# Patient Record
Sex: Female | Born: 1960 | Race: White | Hispanic: Yes | Marital: Married | Smoking: Former smoker
Health system: Southern US, Community
[De-identification: ages and names within clinical notes are randomized; demographics above are authoritative.]

---

## 2006-01-16 ENCOUNTER — Ambulatory Visit: Payer: Self-pay | Admitting: Family Medicine

## 2006-06-11 ENCOUNTER — Emergency Department: Payer: Self-pay | Admitting: Emergency Medicine

## 2006-09-17 ENCOUNTER — Emergency Department: Payer: Self-pay | Admitting: Emergency Medicine

## 2006-12-19 ENCOUNTER — Emergency Department: Payer: Self-pay | Admitting: Emergency Medicine

## 2007-06-17 ENCOUNTER — Emergency Department: Payer: Self-pay | Admitting: Emergency Medicine

## 2008-09-22 ENCOUNTER — Ambulatory Visit: Payer: Self-pay | Admitting: General Practice

## 2009-01-17 ENCOUNTER — Emergency Department: Payer: Self-pay | Admitting: Emergency Medicine

## 2009-12-15 ENCOUNTER — Ambulatory Visit: Payer: Self-pay | Admitting: General Practice

## 2010-06-22 ENCOUNTER — Other Ambulatory Visit: Payer: Self-pay | Admitting: General Practice

## 2010-11-05 ENCOUNTER — Ambulatory Visit: Payer: Self-pay | Admitting: Family Medicine

## 2011-09-11 ENCOUNTER — Encounter: Payer: Self-pay | Admitting: Physician Assistant

## 2011-09-14 ENCOUNTER — Encounter: Payer: Self-pay | Admitting: Physician Assistant

## 2011-10-14 ENCOUNTER — Encounter: Payer: Self-pay | Admitting: Physician Assistant

## 2012-08-20 ENCOUNTER — Ambulatory Visit: Payer: Self-pay | Admitting: Family Medicine

## 2014-02-08 ENCOUNTER — Ambulatory Visit: Payer: Self-pay | Admitting: Family Medicine

## 2014-04-01 ENCOUNTER — Emergency Department: Payer: Self-pay | Admitting: Emergency Medicine

## 2018-01-19 ENCOUNTER — Encounter: Payer: Self-pay | Admitting: *Deleted

## 2018-01-19 ENCOUNTER — Encounter (INDEPENDENT_AMBULATORY_CARE_PROVIDER_SITE_OTHER): Payer: Self-pay

## 2018-01-19 ENCOUNTER — Ambulatory Visit
Admission: RE | Admit: 2018-01-19 | Discharge: 2018-01-19 | Disposition: A | Discharge status: Home / Self Care | Payer: Self-pay | Source: Ambulatory Visit | Attending: Oncology | Admitting: Oncology

## 2018-01-19 ENCOUNTER — Ambulatory Visit: Payer: Self-pay | Attending: Oncology | Admitting: *Deleted

## 2018-01-19 VITALS — BP 122/83 | HR 65 | Resp 18 | Ht 62.0 in | Wt 165.2 lb

## 2018-01-19 DIAGNOSIS — Z0000 Encounter for general adult medical examination without abnormal findings: Secondary | ICD-10-CM

## 2018-01-19 NOTE — Progress Notes (Signed)
Letter mailed from the [facility] to inform patient of her normal mammogram results.  Patient is to follow-up with annual screening in one year.  HSIS to Christy. 

## 2018-01-19 NOTE — Patient Instructions (Signed)
Gave patient hand-out, Women Staying Healthy, Active and Well from [facility], with education on breast health, pap smears, heart and colon health. 

## 2018-01-19 NOTE — Progress Notes (Signed)
  Subjective:     Patient ID: Haley Hayden, female   DOB: 31-Jul-1960, 57 y.o.   MRN: [id number]  HPI   Review of Systems     Objective:   Physical Exam  Pulmonary/Chest: Right breast exhibits inverted nipple. Right breast exhibits no mass, no nipple discharge, no skin change and no tenderness. Left breast exhibits no inverted nipple, no mass, no nipple discharge, no skin change and no tenderness.  Right nipple inverted - normal per patient         Assessment:     57 year old English speaking Hispanic female returns to [facility] for annual exam.  Clinical breast exam unremarkable.  Taught self breast awareness.  Last pap 03/2014.  Patient states she is scheduled for her pap on Monday, October 14th at the [facility] for her pap smear.  Patient has been screened for eligibility.  She does not have any insurance, Medicare or Medicaid.  She also meets financial eligibility.  Hand-out given on the Affordable Care Act. Risk Assessment    Risk Scores      01/19/2018   Last edited by: Glory Buff, RN   5-year risk: 0.7 %   Lifetime risk: 4.5 %            Plan:     Screening mammogram ordered.  Will follow-up per [facility] protocol.

## 2018-03-24 ENCOUNTER — Encounter: Payer: Self-pay | Admitting: Family Medicine

## 2019-03-14 ENCOUNTER — Other Ambulatory Visit: Payer: Self-pay

## 2019-03-14 ENCOUNTER — Emergency Department: Admission: EM | Admit: 2019-03-14 | Discharge: 2019-03-14 | Discharge status: Against Medical Advice | Payer: Self-pay

## 2019-09-16 ENCOUNTER — Emergency Department
Admission: EM | Admit: 2019-09-16 | Discharge: 2019-09-16 | Disposition: A | Discharge status: Home / Self Care | Payer: BC Managed Care – PPO | Attending: Emergency Medicine | Admitting: Emergency Medicine

## 2019-09-16 ENCOUNTER — Emergency Department: Payer: BC Managed Care – PPO

## 2019-09-16 ENCOUNTER — Encounter: Payer: Self-pay | Admitting: *Deleted

## 2019-09-16 DIAGNOSIS — R519 Headache, unspecified: Secondary | ICD-10-CM | POA: Diagnosis not present

## 2019-09-16 DIAGNOSIS — J029 Acute pharyngitis, unspecified: Secondary | ICD-10-CM | POA: Diagnosis not present

## 2019-09-16 DIAGNOSIS — Z20822 Contact with and (suspected) exposure to covid-19: Secondary | ICD-10-CM | POA: Diagnosis not present

## 2019-09-16 DIAGNOSIS — R5383 Other fatigue: Secondary | ICD-10-CM | POA: Insufficient documentation

## 2019-09-16 DIAGNOSIS — Z8616 Personal history of COVID-19: Secondary | ICD-10-CM | POA: Insufficient documentation

## 2019-09-16 DIAGNOSIS — R509 Fever, unspecified: Secondary | ICD-10-CM | POA: Insufficient documentation

## 2019-09-16 DIAGNOSIS — M542 Cervicalgia: Secondary | ICD-10-CM | POA: Insufficient documentation

## 2019-09-16 DIAGNOSIS — Z87891 Personal history of nicotine dependence: Secondary | ICD-10-CM | POA: Diagnosis not present

## 2019-09-16 LAB — URINALYSIS, COMPLETE (UACMP) WITH MICROSCOPIC
Bilirubin Urine: NEGATIVE
Glucose, UA: NEGATIVE mg/dL
Hgb urine dipstick: NEGATIVE
Ketones, ur: NEGATIVE mg/dL
Leukocytes,Ua: NEGATIVE
Nitrite: NEGATIVE
Protein, ur: NEGATIVE mg/dL
Specific Gravity, Urine: 1.011 (ref 1.005–1.030)
pH: 6 (ref 5.0–8.0)

## 2019-09-16 LAB — CBC
HCT: 40.1 % (ref 36.0–46.0)
Hemoglobin: 13.5 g/dL (ref 12.0–15.0)
MCH: 29.6 pg (ref 26.0–34.0)
MCHC: 33.7 g/dL (ref 30.0–36.0)
MCV: 87.9 fL (ref 80.0–100.0)
Platelets: 208 10*3/uL (ref 150–400)
RBC: 4.56 MIL/uL (ref 3.87–5.11)
RDW: 12.3 % (ref 11.5–15.5)
WBC: 8.7 10*3/uL (ref 4.0–10.5)
nRBC: 0 % (ref 0.0–0.2)

## 2019-09-16 LAB — BASIC METABOLIC PANEL
Anion gap: 8 (ref 5–15)
BUN: 11 mg/dL (ref 6–20)
CO2: 29 mmol/L (ref 22–32)
Calcium: 9.3 mg/dL (ref 8.9–10.3)
Chloride: 103 mmol/L (ref 98–111)
Creatinine, Ser: 0.65 mg/dL (ref 0.44–1.00)
GFR calc Af Amer: >60 mL/min (ref >60)
GFR calc non Af Amer: >60 mL/min (ref >60)
Glucose, Bld: 100 mg/dL — ABNORMAL HIGH (ref 70–99)
Potassium: 3.9 mmol/L (ref 3.5–5.1)
Sodium: 140 mmol/L (ref 135–145)

## 2019-09-16 LAB — CSF CELL COUNT WITH DIFFERENTIAL
Eosinophils, CSF: 0 % (ref 0–1)
Eosinophils, CSF: 0 % (ref 0–1)
Lymphs, CSF: 0 % — ABNORMAL LOW (ref 40–80)
Lymphs, CSF: 0 % — ABNORMAL LOW (ref 40–80)
Monocyte-Macrophage-Spinal Fluid: 0 % — ABNORMAL LOW (ref 15–45)
Monocyte-Macrophage-Spinal Fluid: 0 % — ABNORMAL LOW (ref 15–45)
RBC Count, CSF: 0 /mm3
RBC Count, CSF: 288 /mm3 — ABNORMAL HIGH
Segmented Neutrophils-CSF: 0 % (ref 0–6)
Segmented Neutrophils-CSF: 0 % (ref 0–6)
Tube #: 1
Tube #: 1
WBC, CSF: 0 /mm3 (ref 0–5)
WBC, CSF: 0 /mm3 (ref 0–5)

## 2019-09-16 LAB — PROTEIN AND GLUCOSE, CSF
Glucose, CSF: 65 mg/dL (ref 40–70)
Total  Protein, CSF: 27 mg/dL (ref 15–45)

## 2019-09-16 LAB — SARS CORONAVIRUS 2 BY RT PCR (HOSPITAL ORDER, PERFORMED IN ~~LOC~~ HOSPITAL LAB): SARS Coronavirus 2: NEGATIVE

## 2019-09-16 MED ORDER — ONDANSETRON HCL 4 MG/2ML IJ SOLN
4.0000 mg | Freq: Once | INTRAMUSCULAR | Status: AC
  Administered 2019-09-16: 4 mg via INTRAVENOUS
  Filled 2019-09-16: qty 2

## 2019-09-16 MED ORDER — HYDROCODONE-ACETAMINOPHEN 5-325 MG PO TABS: 1.0000 | ORAL_TABLET | ORAL | 0 refills | Status: DC | PRN

## 2019-09-16 MED ORDER — LORAZEPAM 2 MG/ML IJ SOLN
1.0000 mg | Freq: Once | INTRAMUSCULAR | Status: AC
  Administered 2019-09-16: 1 mg via INTRAVENOUS
  Filled 2019-09-16: qty 1

## 2019-09-16 MED ORDER — DOXYCYCLINE HYCLATE 100 MG PO TABS: 100.0000 mg | ORAL_TABLET | Freq: Two times a day (BID) | ORAL | 0 refills | Status: DC

## 2019-09-16 MED ORDER — SODIUM CHLORIDE 0.9 % IV BOLUS
1000.0000 mL | Freq: Once | INTRAVENOUS | Status: AC
  Administered 2019-09-16: 1000 mL via INTRAVENOUS

## 2019-09-16 MED ORDER — FENTANYL CITRATE (PF) 100 MCG/2ML IJ SOLN
100.0000 ug | Freq: Once | INTRAMUSCULAR | Status: AC
  Administered 2019-09-16: 100 ug via INTRAVENOUS
  Filled 2019-09-16: qty 2

## 2019-09-16 MED ORDER — OXYCODONE-ACETAMINOPHEN 5-325 MG PO TABS
1.0000 | ORAL_TABLET | Freq: Once | ORAL | Status: AC
  Administered 2019-09-16: 1 via ORAL
  Filled 2019-09-16: qty 1

## 2019-09-16 MED ORDER — LIDOCAINE HCL (PF) 1 % IJ SOLN
5.0000 mL | Freq: Once | INTRAMUSCULAR | Status: AC
  Administered 2019-09-16: 5 mL
  Filled 2019-09-16: qty 5

## 2019-09-16 NOTE — ED Triage Notes (Signed)
Pt is here for neck stiffness and headache which began Sunday and has been getting worse.  Pt went to [facility] and was told that she needed to come to the ED.  Pt states that she has been taking tylenol and is unsure if she has had fever.  Pt is alert and oriented.  Pt was hypertensive at [facility]

## 2019-09-16 NOTE — ED Provider Notes (Signed)
[facility] Emergency Department Provider Note  Time seen: 5:42 PM  I have reviewed the triage vital signs and the nursing notes.   HISTORY  Chief Complaint Headache (neck stiffness)   HPI Haley Hayden is a 59 y.o. female presents to the emergency department for headache neck pain and fever.  According to the patient approximately 6 days ago she developed some sore throat and neck discomfort.  States over the weekend the neck became very stiff continue to have headache and generalized fatigue at times.  States yesterday she began experiencing subjective fever with worsening neck pain/stiffness.  No known sick contacts.  Patient has had Covid in the past and has been vaccinated as well.  Denies any shortness of breath cough.   History reviewed. No pertinent past medical history.  There are no problems to display for this patient.   History reviewed. No pertinent surgical history.  Prior to Admission medications   Not on File    No Known Allergies  Family History  Problem Relation Age of Onset  . Breast cancer Neg Hx     Social History Social History   Tobacco Use  . Smoking status: Former Games developer  . Smokeless tobacco: Never Used  Substance Use Topics  . Alcohol use: Not on file  . Drug use: Not on file    Review of Systems Constitutional: Positive for fever ENT: Negative for recent illness/congestion states mild sore throat. Cardiovascular: Negative for chest pain. Respiratory: Negative for shortness of breath. Gastrointestinal: Negative for abdominal pain, vomiting and diarrhea. Musculoskeletal: States neck pain and stiffness Neurological: Positive for headache All other ROS negative  ____________________________________________   PHYSICAL EXAM:  VITAL SIGNS: ED Triage Vitals  Enc Vitals Group     BP 09/16/19 1659 (!) 169/94     Pulse Rate 09/16/19 1659 (!) 102     Resp 09/16/19 1659 18     Temp 09/16/19 1659 100.2  F (37.9 C)     Temp src --      SpO2 09/16/19 1659 98 %     Weight 09/16/19 1658 182 lb (82.6 kg)     Height 09/16/19 1658 5\' 2"  (1.575 m)     Head Circumference --      Peak Flow --      Pain Score 09/16/19 1705 9     Pain Loc --      Pain Edu? --      Excl. in GC? --    Constitutional: Alert and oriented. Well appearing and in no distress. Eyes: Normal exam ENT      Head: Normocephalic and atraumatic.      Mouth/Throat: Mucous membranes are moist.  No significant pharyngeal erythema, no exudates or tonsillar hypertrophy. Cardiovascular: Normal rate, regular rhythm.  Respiratory: Normal respiratory effort without tachypnea nor retractions. Breath sounds are clear  Gastrointestinal: Soft and nontender. No distention.   Musculoskeletal: Nontender with normal range of motion in all extremities.  Patient does have moderate neck discomfort with attempted range of motion with stiffness/meningeal signs. Neurologic:  Normal speech and language. No gross focal neurologic deficits Skin:  Skin is warm, dry and intact.  Psychiatric: Mood and affect are normal.   ____________________________________________    EKG  EKG viewed and interpreted by myself shows a normal sinus rhythm at 93 bpm with a narrow QRS, normal axis, normal intervals, no concerning ST changes.  ____________________________________________    RADIOLOGY  CT head negative  ____________________________________________   INITIAL IMPRESSION /  ASSESSMENT AND PLAN / ED COURSE  Pertinent labs & imaging results that were available during my care of the patient were reviewed by me and considered in my medical decision making (see chart for details).   Patient presents to the emergency department for 5 to 6 days of neck pain stiffness headache generalized fatigue now with fever.  Does state mild sore throat however no pharyngeal erythema or exudates noted.  Patient does have a fairly stiff neck on examination.  Lab work is  reassuring.  Urine is pending.  We will obtain a CT scan of the head and likely proceed with lumbar puncture.  I have verbally consented the patient for lumbar puncture after discussing pros and cons.  CT scan head is negative.  Patient signed written consent form.  Procedure lumbar puncture, performed without issue.  Clear CSF.  We will send to the lab.  Awaiting urinalysis.  Patient's urinalysis is negative.  Overall patient appears well.  CSF is nonrevealing.  We will place on doxycycline as a precaution discharge patient with PCP follow-up.  Patient agreeable to plan of care.  LUMBAR PUNCTURE  Date/Time: 09/16/2019 at 7:17 PM Performed by: Harvest Dark  Consent: Verbal consent obtained. Written consent obtained. Risks and benefits: risks, benefits and alternatives were discussed Consent given by: Patient Patient understanding: patient states understanding of the procedure being performed  Patient consent: the patient's understanding of the procedure matches consent given  Procedure consent: procedure consent matches procedure scheduled  Relevant documents: relevant documents present and verified  Test results: test results available and properly labeled Site marked: the operative site was marked Imaging studies: imaging studies available  Required items: required blood products, implants, devices, and special equipment available  Patient identity confirmed: verbally with patient and arm band  Time out: Immediately prior to procedure a "time out" was called to verify the correct patient, procedure, equipment, support staff and site/side marked as required.  Indications: Evaluate for meningitis Anesthesia: local infiltration Local anesthetic: lidocaine 1% without epinephrine Anesthetic total: 7 ml Analgesia: Ativan prior to procedure Preparation: Patient was prepped and draped in the usual sterile fashion. Lumbar space: L3-L4 interspace Patient's position: left lateral  decubitus Needle gauge: 20 Needle length: 3.5 in Number of attempts: 1 Opening pressure: 22 cm H2O Fluid appearance: Clear Tubes of fluid: 4 Total volume: 8 ml Post-procedure: site cleaned and adhesive bandage applied Patient tolerance: Patient tolerated the procedure well with no immediate complications  Haley Hayden was evaluated in Emergency Department on 09/16/2019 for the symptoms described in the history of present illness. She was evaluated in the context of the global COVID-19 pandemic, which necessitated consideration that the patient might be at risk for infection with the SARS-CoV-2 virus that causes COVID-19. Institutional protocols and algorithms that pertain to the evaluation of patients at risk for COVID-19 are in a state of rapid change based on information released by regulatory bodies including the CDC and federal and state organizations. These policies and algorithms were followed during the patient's care in the ED.  ____________________________________________   FINAL CLINICAL IMPRESSION(S) / ED DIAGNOSES  Fever Neck pain   Harvest Dark, MD 09/16/19 2245

## 2019-09-20 LAB — CSF CULTURE W GRAM STAIN: Culture: NO GROWTH

## 2019-09-21 ENCOUNTER — Other Ambulatory Visit: Payer: Self-pay

## 2019-09-21 ENCOUNTER — Emergency Department
Admission: EM | Admit: 2019-09-21 | Discharge: 2019-09-21 | Disposition: A | Discharge status: Home / Self Care | Payer: BC Managed Care – PPO | Attending: Student in an Organized Health Care Education/Training Program | Admitting: Student in an Organized Health Care Education/Training Program

## 2019-09-21 ENCOUNTER — Emergency Department: Payer: BC Managed Care – PPO

## 2019-09-21 ENCOUNTER — Encounter: Payer: Self-pay | Admitting: Emergency Medicine

## 2019-09-21 DIAGNOSIS — Z8616 Personal history of COVID-19: Secondary | ICD-10-CM | POA: Diagnosis not present

## 2019-09-21 DIAGNOSIS — Z733 Stress, not elsewhere classified: Secondary | ICD-10-CM | POA: Diagnosis not present

## 2019-09-21 DIAGNOSIS — R509 Fever, unspecified: Secondary | ICD-10-CM | POA: Diagnosis not present

## 2019-09-21 DIAGNOSIS — R519 Headache, unspecified: Secondary | ICD-10-CM | POA: Insufficient documentation

## 2019-09-21 DIAGNOSIS — Z87891 Personal history of nicotine dependence: Secondary | ICD-10-CM | POA: Insufficient documentation

## 2019-09-21 DIAGNOSIS — M542 Cervicalgia: Secondary | ICD-10-CM | POA: Insufficient documentation

## 2019-09-21 LAB — CBC WITH DIFFERENTIAL/PLATELET
Abs Immature Granulocytes: 0.02 10*3/uL (ref 0.00–0.07)
Basophils Absolute: 0 10*3/uL (ref 0.0–0.1)
Basophils Relative: 1 %
Eosinophils Absolute: 0.1 10*3/uL (ref 0.0–0.5)
Eosinophils Relative: 1 %
HCT: 40.1 % (ref 36.0–46.0)
Hemoglobin: 13.3 g/dL (ref 12.0–15.0)
Immature Granulocytes: 0 %
Lymphocytes Relative: 28 %
Lymphs Abs: 1.5 10*3/uL (ref 0.7–4.0)
MCH: 29.5 pg (ref 26.0–34.0)
MCHC: 33.2 g/dL (ref 30.0–36.0)
MCV: 88.9 fL (ref 80.0–100.0)
Monocytes Absolute: 0.3 10*3/uL (ref 0.1–1.0)
Monocytes Relative: 5 %
Neutro Abs: 3.5 10*3/uL (ref 1.7–7.7)
Neutrophils Relative %: 65 %
Platelets: 257 10*3/uL (ref 150–400)
RBC: 4.51 MIL/uL (ref 3.87–5.11)
RDW: 12 % (ref 11.5–15.5)
WBC: 5.4 10*3/uL (ref 4.0–10.5)
nRBC: 0 % (ref 0.0–0.2)

## 2019-09-21 LAB — COMPREHENSIVE METABOLIC PANEL
ALT: 40 U/L (ref 0–44)
AST: 25 U/L (ref 15–41)
Albumin: 3.9 g/dL (ref 3.5–5.0)
Alkaline Phosphatase: 74 U/L (ref 38–126)
Anion gap: 9 (ref 5–15)
BUN: 12 mg/dL (ref 6–20)
CO2: 29 mmol/L (ref 22–32)
Calcium: 9.3 mg/dL (ref 8.9–10.3)
Chloride: 103 mmol/L (ref 98–111)
Creatinine, Ser: 0.63 mg/dL (ref 0.44–1.00)
GFR calc Af Amer: >60 mL/min (ref >60)
GFR calc non Af Amer: >60 mL/min (ref >60)
Glucose, Bld: 130 mg/dL — ABNORMAL HIGH (ref 70–99)
Potassium: 3.8 mmol/L (ref 3.5–5.1)
Sodium: 141 mmol/L (ref 135–145)
Total Bilirubin: 0.8 mg/dL (ref 0.3–1.2)
Total Protein: 7.4 g/dL (ref 6.5–8.1)

## 2019-09-21 MED ORDER — PROCHLORPERAZINE EDISYLATE 10 MG/2ML IJ SOLN
10.0000 mg | Freq: Once | INTRAMUSCULAR | Status: AC
  Administered 2019-09-21: 10 mg via INTRAVENOUS
  Filled 2019-09-21: qty 2

## 2019-09-21 MED ORDER — PROCHLORPERAZINE MALEATE 10 MG PO TABS: 10.0000 mg | ORAL_TABLET | Freq: Four times a day (QID) | ORAL | 0 refills | Status: AC | PRN

## 2019-09-21 MED ORDER — ACETAMINOPHEN 325 MG PO TABS
650.0000 mg | ORAL_TABLET | Freq: Once | ORAL | Status: AC
  Administered 2019-09-21: 650 mg via ORAL
  Filled 2019-09-21: qty 2

## 2019-09-21 MED ORDER — DIPHENHYDRAMINE HCL 50 MG/ML IJ SOLN
12.5000 mg | Freq: Once | INTRAMUSCULAR | Status: AC
  Administered 2019-09-21: 12.5 mg via INTRAVENOUS
  Filled 2019-09-21: qty 1

## 2019-09-21 MED ORDER — IOHEXOL 350 MG/ML SOLN
75.0000 mL | Freq: Once | INTRAVENOUS | Status: AC | PRN
  Administered 2019-09-21: 75 mL via INTRAVENOUS

## 2019-09-21 NOTE — ED Notes (Signed)
ED Provider at bedside. 

## 2019-09-21 NOTE — Discharge Instructions (Signed)

## 2019-09-21 NOTE — ED Notes (Signed)
Pt returned from CT via stretcher.

## 2019-09-21 NOTE — ED Provider Notes (Signed)
[facility] Emergency Department Provider Note    First MD Initiated Contact with Patient 09/21/19 0957     (approximate)  I have reviewed the triage vital signs and the nursing notes.   HISTORY  Chief Complaint Headache and Emesis    HPI Haley Hayden is a 59 y.o. female no significant past medical history other than being diagnosed with Covid almost 1 year ago to the day.  Presents to the ER for persistent headache x1 week.  Had extensive work-up last week for similar headache was reportedly having some fevers at that time was discharged home on doxycycline.  LP and CSF studies were noninfectious with negative cultures.  Denies any numbness or tingling.  No history of DVT or blood clots.  Not on any anticoagulation.  Denies any trauma.  Still having persistent headache feels like she will have some tension in her neck.  States that she is also undergoing some stress at home.  Denies any chest pain or shortness of breath.  No fevers or chills.    History reviewed. No pertinent past medical history. Family History  Problem Relation Age of Onset  . Breast cancer Neg Hx    History reviewed. No pertinent surgical history. There are no problems to display for this patient.     Prior to Admission medications   Medication Sig Start Date End Date Taking? Authorizing Provider  doxycycline (VIBRA-TABS) 100 MG tablet Take 1 tablet (100 mg total) by mouth 2 (two) times daily. 09/16/19   Minna Antis, MD  HYDROcodone-acetaminophen (NORCO/VICODIN) 5-325 MG tablet Take 1 tablet by mouth every 4 (four) hours as needed. 09/16/19   Minna Antis, MD  prochlorperazine (COMPAZINE) 10 MG tablet Take 1 tablet (10 mg total) by mouth every 6 (six) hours as needed for nausea or vomiting. 09/21/19   Willy Eddy, MD    Allergies Patient has no known allergies.    Social History Social History   Tobacco Use  . Smoking status: Former Games developer  .  Smokeless tobacco: Never Used  Substance Use Topics  . Alcohol use: Never  . Drug use: Never    Review of Systems Patient denies headaches, rhinorrhea, blurry vision, numbness, shortness of breath, chest pain, edema, cough, abdominal pain, nausea, vomiting, diarrhea, dysuria, fevers, rashes or hallucinations unless otherwise stated above in HPI. ____________________________________________   PHYSICAL EXAM:  VITAL SIGNS: Vitals:   09/21/19 0910  BP: (!) 155/84  Pulse: 69  Resp: 16  Temp: 98.2 F (36.8 C)  SpO2: 96%    Constitutional: Alert and oriented.  Eyes: Conjunctivae are normal.  Head: Atraumatic. Nose: No congestion/rhinnorhea. Mouth/Throat: Mucous membranes are moist.   Neck: No stridor. Painless ROM.  Cardiovascular: Normal rate, regular rhythm. Grossly normal heart sounds.  Good peripheral circulation. Respiratory: Normal respiratory effort.  No retractions. Lungs CTAB. Gastrointestinal: Soft and nontender. No distention. No abdominal bruits. No CVA tenderness. Genitourinary:  Musculoskeletal: No lower extremity tenderness nor edema.  No joint effusions. Neurologic:  CN- intact.  No facial droop, Normal FNF.  Normal heel to shin.  Sensation intact bilaterally. Normal speech and language. No gross focal neurologic deficits are appreciated. No gait instability. Skin:  Skin is warm, dry and intact. No rash noted. Psychiatric: Mood and affect are normal. Speech and behavior are normal.  ____________________________________________   LABS (all labs ordered are listed, but only abnormal results are displayed)  Results for orders placed or performed during the hospital encounter of 09/21/19 (from the past  24 hour(s))  Comprehensive metabolic panel     Status: Abnormal   Collection Time: 09/21/19  9:11 AM  Result Value Ref Range   Sodium 141 135 - 145 mmol/L   Potassium 3.8 3.5 - 5.1 mmol/L   Chloride 103 98 - 111 mmol/L   CO2 29 22 - 32 mmol/L   Glucose, Bld 130  (H) 70 - 99 mg/dL   BUN 12 6 - 20 mg/dL   Creatinine, Ser 2.83 0.44 - 1.00 mg/dL   Calcium 9.3 8.9 - 15.1 mg/dL   Total Protein 7.4 6.5 - 8.1 g/dL   Albumin 3.9 3.5 - 5.0 g/dL   AST 25 15 - 41 U/L   ALT 40 0 - 44 U/L   Alkaline Phosphatase 74 38 - 126 U/L   Total Bilirubin 0.8 0.3 - 1.2 mg/dL   GFR calc non Af Amer >60 >60 mL/min   GFR calc Af Amer >60 >60 mL/min   Anion gap 9 5 - 15  CBC with Differential     Status: None   Collection Time: 09/21/19  9:11 AM  Result Value Ref Range   WBC 5.4 4.0 - 10.5 K/uL   RBC 4.51 3.87 - 5.11 MIL/uL   Hemoglobin 13.3 12.0 - 15.0 g/dL   HCT 76.1 60.7 - 37.1 %   MCV 88.9 80.0 - 100.0 fL   MCH 29.5 26.0 - 34.0 pg   MCHC 33.2 30.0 - 36.0 g/dL   RDW 06.2 69.4 - 85.4 %   Platelets 257 150 - 400 K/uL   nRBC 0.0 0.0 - 0.2 %   Neutrophils Relative % 65 %   Neutro Abs 3.5 1.7 - 7.7 K/uL   Lymphocytes Relative 28 %   Lymphs Abs 1.5 0.7 - 4.0 K/uL   Monocytes Relative 5 %   Monocytes Absolute 0.3 0.1 - 1.0 K/uL   Eosinophils Relative 1 %   Eosinophils Absolute 0.1 0.0 - 0.5 K/uL   Basophils Relative 1 %   Basophils Absolute 0.0 0.0 - 0.1 K/uL   Immature Granulocytes 0 %   Abs Immature Granulocytes 0.02 0.00 - 0.07 K/uL   ____________________________________________  ____________________________________________  RADIOLOGY  I personally reviewed all radiographic images ordered to evaluate for the above acute complaints and reviewed radiology reports and findings.  These findings were personally discussed with the patient.  Please see medical record for radiology report.  ____________________________________________   PROCEDURES  Procedure(s) performed:  Procedures    Critical Care performed: no ____________________________________________   INITIAL IMPRESSION / ASSESSMENT AND PLAN / ED COURSE  Pertinent labs & imaging results that were available during my care of the patient were reviewed by me and considered in my medical  decision making (see chart for details).   DDX: migraine, tension, sah, meningitis,encephalitis, cluster, vst  Haley Hayden is a 59 y.o. who presents to the ED with headache as described above. Patient nontoxic-appearing afebrile no meningismus. Recent work-up was all reassuring. Repeat blood work here today is reassuring. She has post Covid not any blood thinners given persistent headache will order CTV though her neuro exam is reassuring. Will give migraine cocktail and reassess.  Clinical Course as of Sep 21 1255  Tue Sep 21, 2019  1226 Patient reassessed.  Feels clinically improved.  CTV is reassuring.  Do not feel that further diagnostic testing clinically indicated given this presentation.  Appropriate for outpatient follow-up.   [PR]    Clinical Course User Index [PR] Willy Eddy, MD  The patient was evaluated in Emergency Department today for the symptoms described in the history of present illness. He/she was evaluated in the context of the global COVID-19 pandemic, which necessitated consideration that the patient might be at risk for infection with the SARS-CoV-2 virus that causes COVID-19. Institutional protocols and algorithms that pertain to the evaluation of patients at risk for COVID-19 are in a state of rapid change based on information released by regulatory bodies including the CDC and federal and state organizations. These policies and algorithms were followed during the patient's care in the ED.  As part of my medical decision making, I reviewed the following data within the [facility] notes reviewed and incorporated, Labs reviewed, notes from prior ED visits and  Controlled Substance Database   ____________________________________________   FINAL CLINICAL IMPRESSION(S) / ED DIAGNOSES  Final diagnoses:  Nonintractable headache, unspecified chronicity pattern, unspecified headache type      NEW MEDICATIONS STARTED  DURING THIS VISIT:  New Prescriptions   PROCHLORPERAZINE (COMPAZINE) 10 MG TABLET    Take 1 tablet (10 mg total) by mouth every 6 (six) hours as needed for nausea or vomiting.     Note:  This document was prepared using Dragon voice recognition software and may include unintentional dictation errors.    Merlyn Lot, MD 09/21/19 1257

## 2019-09-21 NOTE — ED Triage Notes (Signed)
Pt here for headache and neck pain. Was here for same last week and had full work up including LP that was negative.  Pt given abx and pain meds.  Headache has never went away since then.  Was having fever last week but no longer having fever.

## 2021-07-26 IMAGING — CT CT HEAD W/O CM
3 of 4 series · 16 of 47 positions shown, 19 images · non-contrast
Comparison: None.

CLINICAL DATA: Headache

EXAM:
CT HEAD WITHOUT CONTRAST
TECHNIQUE: Contiguous axial images were obtained from the base of the skull
through the vertex without intravenous contrast.

[Series 2: head wo · axial · 0.40mm/px · z∈[-183,-58]mm · 10 of 29 slices shown, 13 images]
[im 2/29  brain]
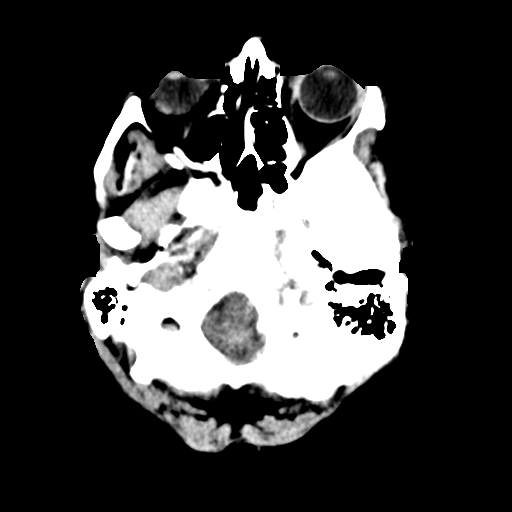
[im 2/29  bone]
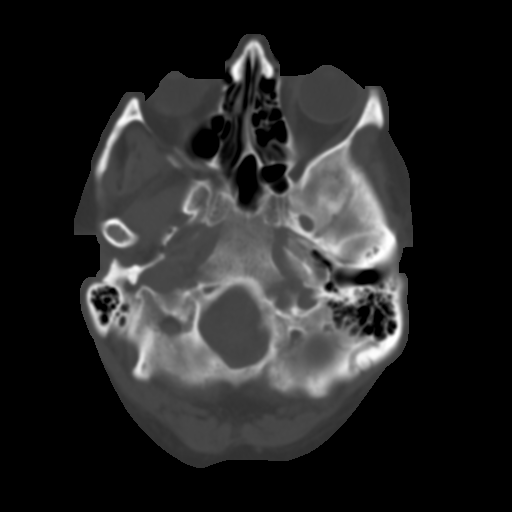
[im 6/29  brain]
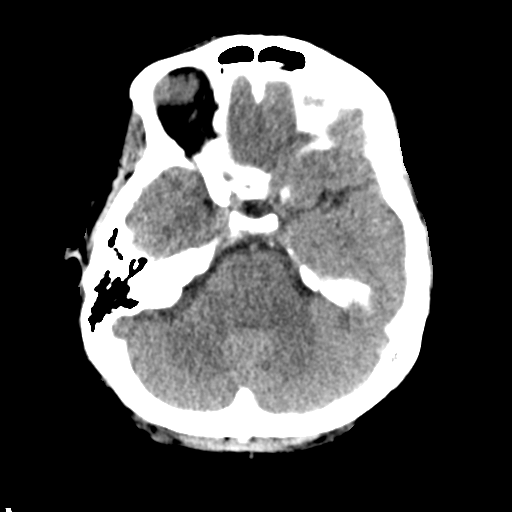
[im 8/29  brain]
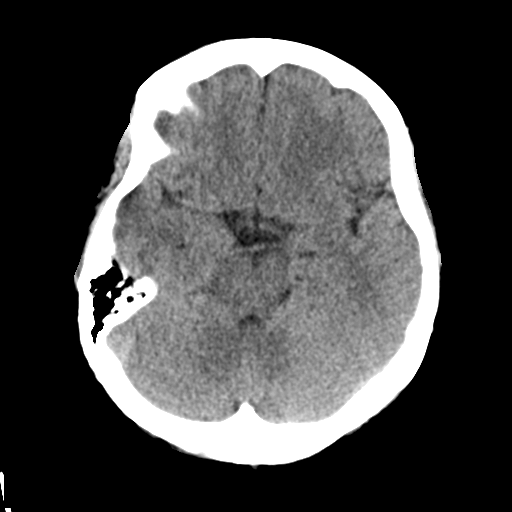
[im 10/29  brain]
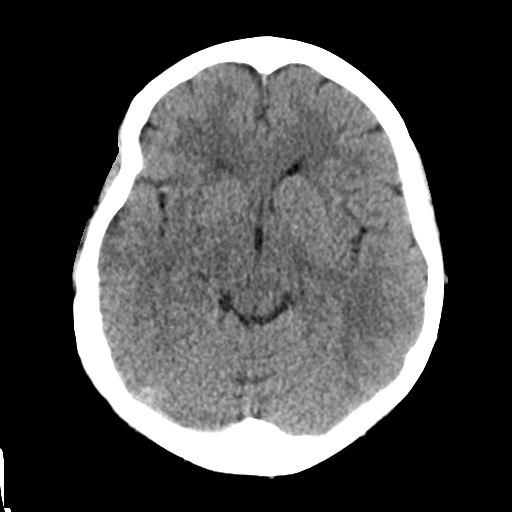
[im 14/29  brain]
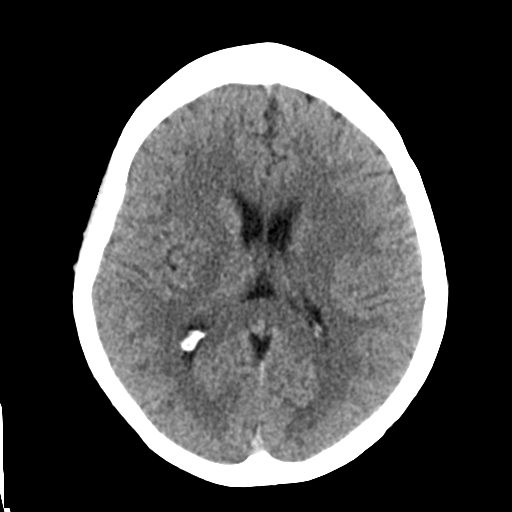
[im 14/29  bone]
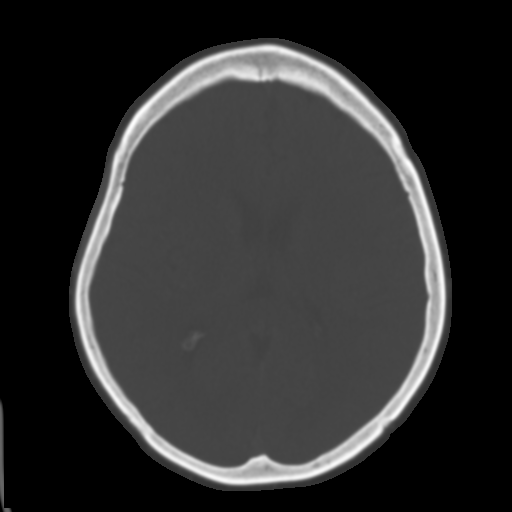
[im 15/29  brain]
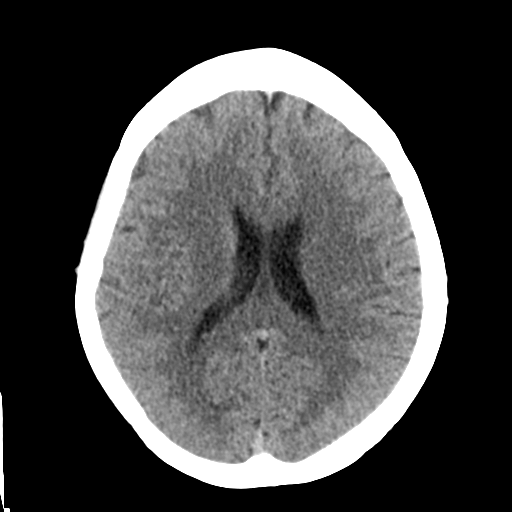
[im 19/29  brain]
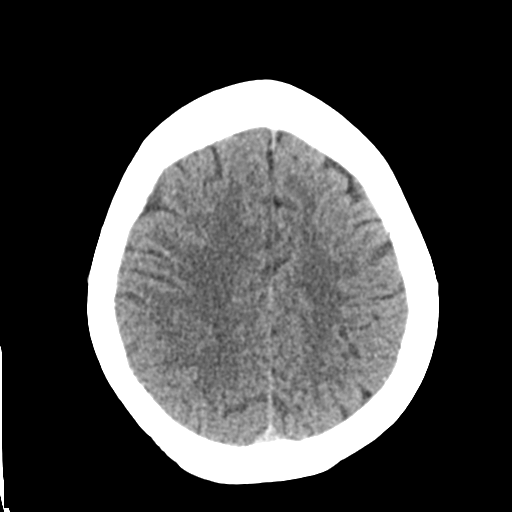
[im 21/29  brain]
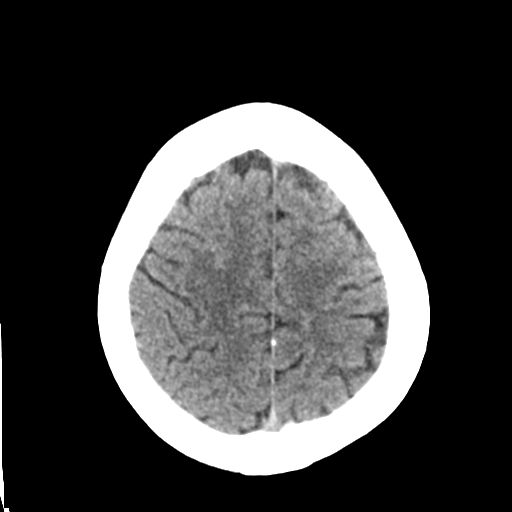
[im 23/29  brain]
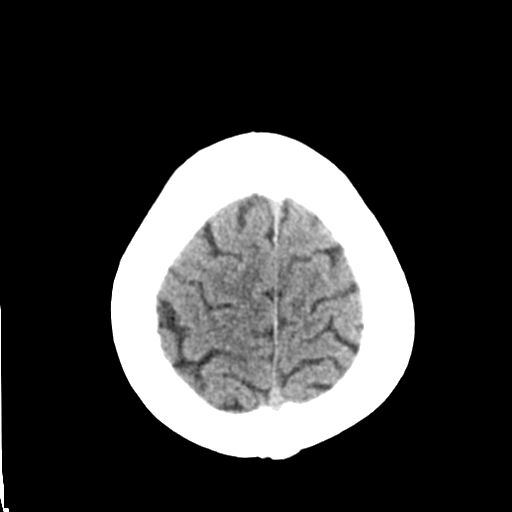
[im 23/29  bone]
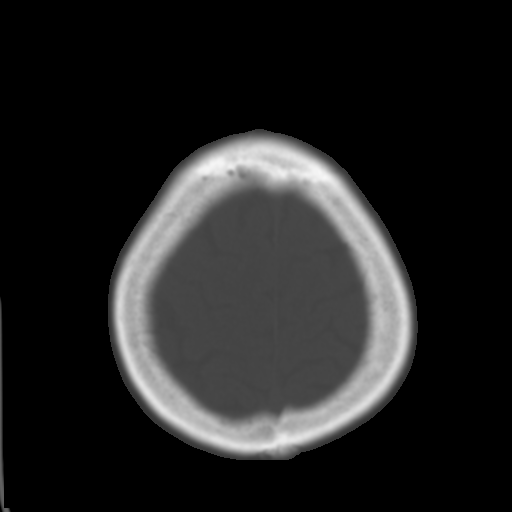
[im 27/29  brain]
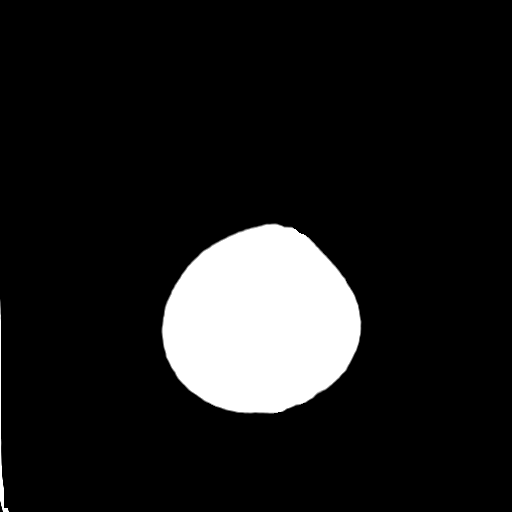

[Series 4: coronal soft tissue · coronal · 0.35mm/px · 3 of 63 slices shown]
[im 21/63  brain]
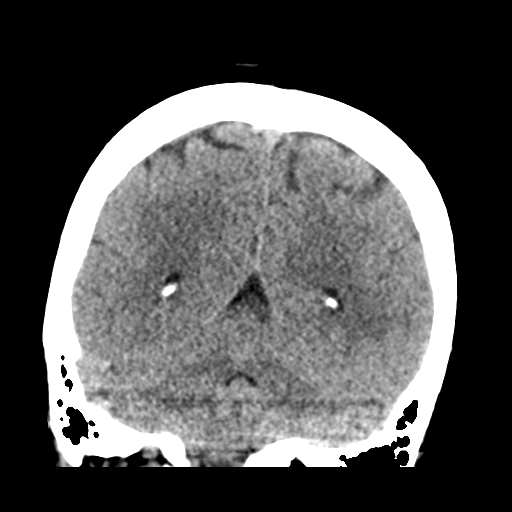
[im 28/63  brain]
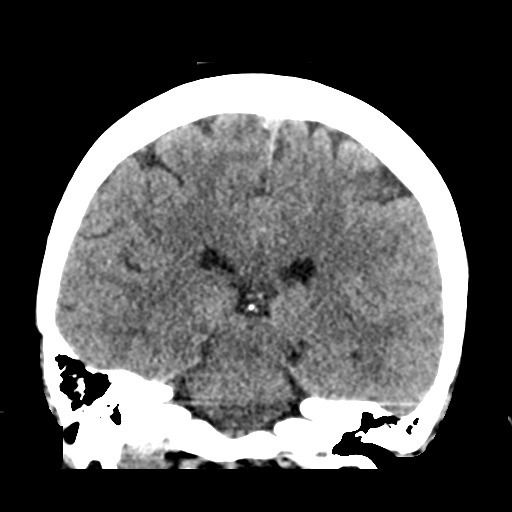
[im 35/63  brain]
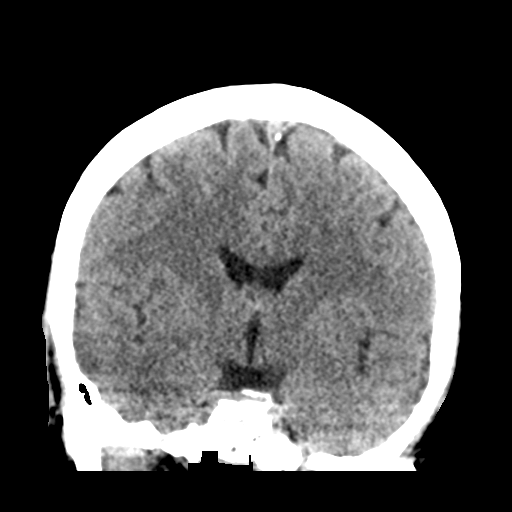

[Series 5: sagittal soft tissue · sagittal · 0.33mm/px · 3 of 53 slices shown]
[im 18/53  brain]
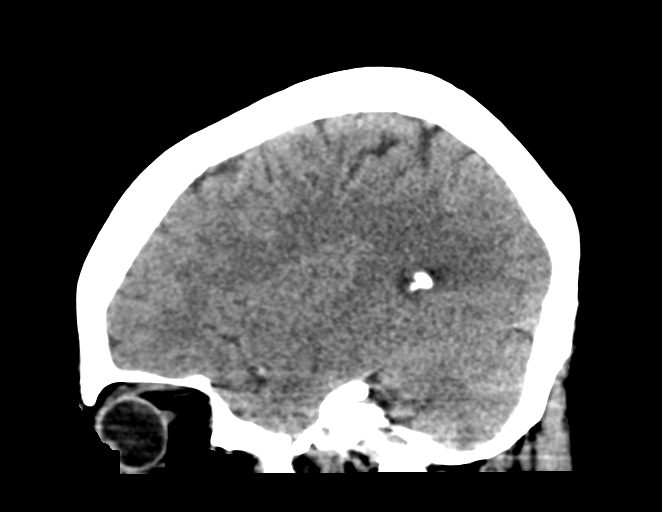
[im 27/53  brain]
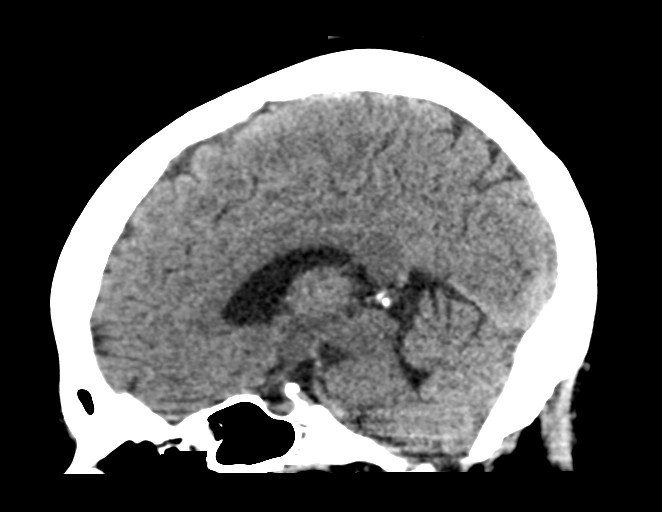
[im 35/53  brain]
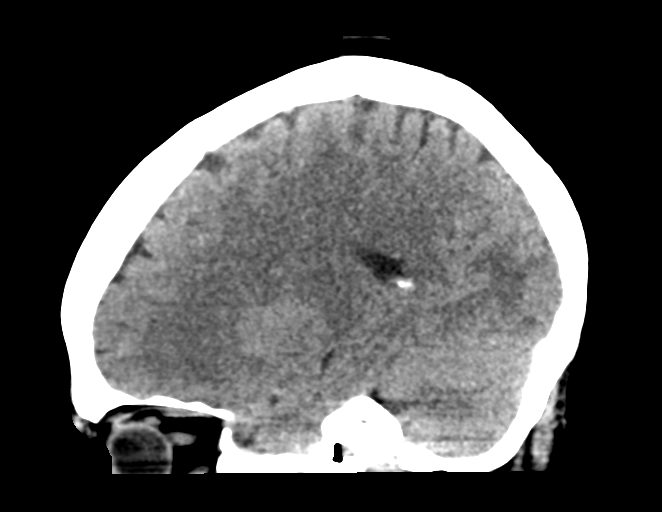

[16 of 47 positions shown; findings below may reference images not displayed]

FINDINGS: Brain: No acute intracranial abnormality. Specifically, no
hemorrhage, hydrocephalus, mass lesion, acute infarction, or
significant intracranial injury.

Vascular: No hyperdense vessel or unexpected calcification.

Skull: No acute calvarial abnormality.

Sinuses/Orbits: Visualized paranasal sinuses and mastoids clear.
Orbital soft tissues unremarkable.

Other: None
IMPRESSION: Normal study.

## 2021-10-24 ENCOUNTER — Other Ambulatory Visit: Payer: Self-pay | Admitting: Obstetrics and Gynecology

## 2021-10-24 DIAGNOSIS — Z1231 Encounter for screening mammogram for malignant neoplasm of breast: Secondary | ICD-10-CM

## 2021-11-15 ENCOUNTER — Inpatient Hospital Stay: Admission: RE | Admit: 2021-11-15 | Payer: BC Managed Care – PPO | Source: Ambulatory Visit

## 2022-03-11 ENCOUNTER — Encounter: Payer: Self-pay | Admitting: General Practice

## 2022-03-11 ENCOUNTER — Encounter: Payer: Self-pay | Admitting: *Deleted

## 2022-03-11 NOTE — Progress Notes (Signed)
Fax notification received stating Fluzone vaccine has been administered to pt on 01/27/22.  [facility]  [id number] [place], [place]

## 2022-10-08 ENCOUNTER — Other Ambulatory Visit: Payer: Self-pay | Admitting: Obstetrics

## 2022-10-08 DIAGNOSIS — Z1231 Encounter for screening mammogram for malignant neoplasm of breast: Secondary | ICD-10-CM

## 2023-04-17 ENCOUNTER — Other Ambulatory Visit: Payer: Self-pay | Admitting: Family Medicine

## 2023-04-17 DIAGNOSIS — Z1231 Encounter for screening mammogram for malignant neoplasm of breast: Secondary | ICD-10-CM

## 2023-04-18 DIAGNOSIS — J069 Acute upper respiratory infection, unspecified: Secondary | ICD-10-CM | POA: Diagnosis not present

## 2023-06-09 ENCOUNTER — Other Ambulatory Visit: Payer: Self-pay | Admitting: Family Medicine

## 2023-06-09 DIAGNOSIS — Z1231 Encounter for screening mammogram for malignant neoplasm of breast: Secondary | ICD-10-CM

## 2023-12-24 ENCOUNTER — Other Ambulatory Visit: Payer: Self-pay | Admitting: Medical Genetics

## 2023-12-31 ENCOUNTER — Ambulatory Visit
Admission: RE | Admit: 2023-12-31 | Discharge: 2023-12-31 | Disposition: A | Discharge status: Home / Self Care | Payer: Self-pay | Source: Ambulatory Visit | Attending: Family Medicine | Admitting: Family Medicine

## 2023-12-31 ENCOUNTER — Other Ambulatory Visit
Admission: RE | Admit: 2023-12-31 | Discharge: 2023-12-31 | Disposition: A | Discharge status: Home / Self Care | Payer: Self-pay | Source: Ambulatory Visit | Attending: Family Medicine | Admitting: Family Medicine

## 2023-12-31 DIAGNOSIS — Z1231 Encounter for screening mammogram for malignant neoplasm of breast: Secondary | ICD-10-CM | POA: Diagnosis present

## 2024-01-09 LAB — GENECONNECT MOLECULAR SCREEN: Genetic Analysis Overall Interpretation: NEGATIVE

## 2024-03-10 ENCOUNTER — Other Ambulatory Visit: Payer: Self-pay | Admitting: Student

## 2024-03-10 DIAGNOSIS — R519 Headache, unspecified: Secondary | ICD-10-CM

## 2024-03-22 ENCOUNTER — Ambulatory Visit: Admission: RE | Admit: 2024-03-22 | Discharge: 2024-03-22 | Attending: Student | Admitting: Student

## 2024-03-22 DIAGNOSIS — R519 Headache, unspecified: Secondary | ICD-10-CM

## 2024-03-22 MED ORDER — GADOBUTROL 1 MMOL/ML IV SOLN
7.0000 mL | Freq: Once | INTRAVENOUS | Status: AC | PRN
  Administered 2024-03-22: 7 mL via INTRAVENOUS

## 2024-04-27 ENCOUNTER — Emergency Department

## 2024-04-27 ENCOUNTER — Other Ambulatory Visit: Payer: Self-pay

## 2024-04-27 ENCOUNTER — Emergency Department
Admission: EM | Admit: 2024-04-27 | Discharge: 2024-04-27 | Disposition: A | Discharge status: Home / Self Care | Attending: Emergency Medicine | Admitting: Emergency Medicine

## 2024-04-27 ENCOUNTER — Encounter: Payer: Self-pay | Admitting: Emergency Medicine

## 2024-04-27 DIAGNOSIS — R0602 Shortness of breath: Secondary | ICD-10-CM | POA: Diagnosis present

## 2024-04-27 DIAGNOSIS — J189 Pneumonia, unspecified organism: Secondary | ICD-10-CM | POA: Diagnosis not present

## 2024-04-27 LAB — CBC
HCT: 39.3 % (ref 36.0–46.0)
Hemoglobin: 13.1 g/dL (ref 12.0–15.0)
MCH: 30 pg (ref 26.0–34.0)
MCHC: 33.3 g/dL (ref 30.0–36.0)
MCV: 89.9 fL (ref 80.0–100.0)
Platelets: 159 K/uL (ref 150–400)
RBC: 4.37 MIL/uL (ref 3.87–5.11)
RDW: 12 % (ref 11.5–15.5)
WBC: 4.2 K/uL (ref 4.0–10.5)
nRBC: 0 % (ref 0.0–0.2)

## 2024-04-27 LAB — BASIC METABOLIC PANEL WITH GFR
Anion gap: 9 (ref 5–15)
BUN: 12 mg/dL (ref 8–23)
CO2: 27 mmol/L (ref 22–32)
Calcium: 9.5 mg/dL (ref 8.9–10.3)
Chloride: 103 mmol/L (ref 98–111)
Creatinine, Ser: 0.73 mg/dL (ref 0.44–1.00)
GFR, Estimated: >60 mL/min
Glucose, Bld: 122 mg/dL — ABNORMAL HIGH (ref 70–99)
Potassium: 4 mmol/L (ref 3.5–5.1)
Sodium: 140 mmol/L (ref 135–145)

## 2024-04-27 LAB — TROPONIN T, HIGH SENSITIVITY: Troponin T High Sensitivity: <15 ng/L (ref 0–19)

## 2024-04-27 MED ORDER — DOXYCYCLINE HYCLATE 100 MG PO TABS: 100.0000 mg | ORAL_TABLET | Freq: Two times a day (BID) | ORAL | 0 refills | Status: AC

## 2024-04-27 NOTE — Discharge Instructions (Addendum)
 We are starting you on antibiotics to cover you for the possibility of pneumonia.  Your chest x-ray and blood work were reassuring but given the symptoms and ongoing for a week it could just be the continued viral infection or could be pneumonia that is forming.  If you develop worsening shortness of breath, swelling in 1 leg, pain with breathing or any other concerns you should return to the ER as we had discussed for further workup and evaluation

## 2024-04-27 NOTE — ED Triage Notes (Signed)
 Pt reports SHOB and cough that has gotten worse since last week. Pt also reporting fever at home. Pt reports she was seen at [facility] last [facility] for same.

## 2024-04-27 NOTE — ED Provider Notes (Signed)
 "  [facility] Provider Note    Event Date/Time   First MD Initiated Contact with Patient 04/27/24 1949     (approximate)   History   Shortness of Breath   HPI  Haley Hayden is a 64 y.o. female with history of estrogen use who comes in with concerns for shortness of breath.  Patient reports being ill for the past 6-7 days.  She was seen on 04/23/2024 and urgent care diagnosed with viral illness and started on Tessalon Perles and cough syrup.  She reports that since then she continues to have dry cough, hoarse voice, pain with coughing and some shortness of breath.  She reports that initially she had a fever and felt awful for the first day or 2 and then the fever went away but then she started having the fever again last night.  She did not take any Tylenol  or ibuprofen before coming in.  She does report some shortness of breath associated more with coughing and occasionally walking.  Denies any shortness of breath at rest.  She denies any smoking history or lung history previously.  She does report prior issues with COVID where she was sick.  Denies any history of blood clots.  Denies any respecters for pulmonary embolism other than being on estrogen.  She was concerned that her oxygen levels were low when she was ambulatory at home which is what made her come in today to have her oxygen levels evaluated.   Physical Exam   Triage Vital Signs: ED Triage Vitals [04/27/24 1606]  Encounter Vitals Group     BP (!) 144/84     Girls Systolic BP Percentile      Girls Diastolic BP Percentile      Boys Systolic BP Percentile      Boys Diastolic BP Percentile      Pulse Rate 97     Resp 18     Temp 99.1 F (37.3 C)     Temp src      SpO2 98 %     Weight      Height      Head Circumference      Peak Flow      Pain Score 0     Pain Loc      Pain Education      Exclude from Growth Chart     Most recent vital signs: Vitals:   04/27/24 1606 04/27/24  2031  BP: (!) 144/84 (!) 140/81  Pulse: 97 73  Resp: 18 20  Temp: 99.1 F (37.3 C)   SpO2: 98% 97%     General: Awake, no distress.  CV:  Good peripheral perfusion.  Resp:  Normal effort.  Clear lung Abd:  No distention.  Soft and nontender Other:  No swelling in legs.  No calf tenderness Patient speaking in full sentences, frequently coughing, hoarse voice noted  ED Results / Procedures / Treatments   Labs (all labs ordered are listed, but only abnormal results are displayed) Labs Reviewed  BASIC METABOLIC PANEL WITH GFR - Abnormal; Notable for the following components:      Result Value   Glucose, Bld 122 (*)    All other components within normal limits  CBC  TROPONIN T, HIGH SENSITIVITY     EKG  My interpretation of EKG:  Normal sinus rate of 84 without any ST elevation or T wave inversions, normal intervals  RADIOLOGY I have reviewed the xray personally and interpreted  no evidence of any pneumonia   PROCEDURES:  Critical Care performed: No  Procedures   MEDICATIONS ORDERED IN ED: Medications - No data to display   IMPRESSION / MDM / ASSESSMENT AND PLAN / ED COURSE  I reviewed the triage vital signs and the nursing notes.   Patient's presentation is most consistent with acute presentation with potential threat to life or bodily function.   Patient comes in with worsening cough, hoarse voice, fevers, shortness of breath with exertion this sounds like more likely continued viral illness or the possibility of postviral pneumonia.  She is very well-appearing no evidence of DVT.  This seems less likely related to blood clot she does have a risk factor of estrogen use but her EKG without evidence of right heart strain, troponin was negative and patient very well-appearing.  We discussed further workup with D-dimer but after discussion with family and low suspicion pt has opted to hold off and she understands return precautions in regards to this.  Patient is not  short of breath  at rest, no pleuritic chest pain, and has normal vital signs at rest- We did do an ambulatory sat and her oxygen levels remained above 95% and were 97% at rest.    At this time however we are going to cover her with antibiotics given she has had over a week course in case there is a postviral pneumonia that is starting I will start her on doxycycline .  Given she is otherwise healthy without any comorbidities I think we can just do the doxycycline .  She has no wheezing on examination no smoking history to indicate albuterol, steroids.  Patient felt comfortable with this plan and will follow-up outpatient with her primary care doctor and return if symptoms are changing or worsening       FINAL CLINICAL IMPRESSION(S) / ED DIAGNOSES   Final diagnoses:  Pneumonia due to infectious organism, unspecified laterality, unspecified part of lung     Rx / DC Orders   ED Discharge Orders     None        Note:  This document was prepared using Dragon voice recognition software and may include unintentional dictation errors.   Ernest Ronal BRAVO, MD 04/27/24 2045  "

## 2024-05-21 ENCOUNTER — Ambulatory Visit: Admitting: Pulmonary Disease

## 2024-05-21 ENCOUNTER — Encounter: Payer: Self-pay | Admitting: Pulmonary Disease

## 2024-05-21 VITALS — BP 130/86 | HR 71 | Temp 98.0°F | Ht 62.0 in | Wt 187.0 lb

## 2024-05-21 DIAGNOSIS — R0602 Shortness of breath: Secondary | ICD-10-CM

## 2024-05-21 DIAGNOSIS — R053 Chronic cough: Secondary | ICD-10-CM

## 2024-05-21 DIAGNOSIS — J329 Chronic sinusitis, unspecified: Secondary | ICD-10-CM

## 2024-05-21 DIAGNOSIS — K219 Gastro-esophageal reflux disease without esophagitis: Secondary | ICD-10-CM

## 2024-05-21 LAB — NITRIC OXIDE: Nitric Oxide: 14

## 2024-05-21 MED ORDER — COMBIVENT RESPIMAT 20-100 MCG/ACT IN AERS: 1.0000 | INHALATION_SPRAY | Freq: Four times a day (QID) | RESPIRATORY_TRACT | 3 refills | Status: AC | PRN

## 2024-05-21 MED ORDER — ESOMEPRAZOLE MAGNESIUM 40 MG PO CPDR: 40.0000 mg | DELAYED_RELEASE_CAPSULE | Freq: Every day | ORAL | 2 refills | Status: AC

## 2024-05-21 MED ORDER — RYALTRIS 665-25 MCG/ACT NA SUSP: 2.0000 | Freq: Two times a day (BID) | NASAL | 3 refills | Status: AC

## 2024-05-21 NOTE — Patient Instructions (Signed)
 VISIT SUMMARY:  During your visit, we discussed your persistent cough and sinus issues, as well as your gastroesophageal reflux disease (GERD). We reviewed your symptoms, including the history of your cough, recent fevers, and the impact of certain medications on your reflux.  YOUR PLAN:  -CHRONIC COUGH WITH CHRONIC SINUSITIS: Chronic cough and sinusitis involve long-term inflammation and infection of the sinuses, leading to persistent coughing and other symptoms. We have prescribed a different nasal spray to help with sinus drainage and referred you to an ENT specialist for further evaluation. You should avoid using Tessalon Perles as they may worsen your reflux. Instead, use the prescribed inhaler as needed for your cough and try Delsen for cough relief. Avoid mentholated throat drops and use Lutens or Ricola throat drops instead.  -GASTROESOPHAGEAL REFLUX DISEASE (GERD): GERD is a condition where stomach acid frequently flows back into the tube connecting your mouth and stomach, causing irritation. We have prescribed medication to reduce stomach acid and advised you to avoid using Tessalon Perles as they can worsen your reflux symptoms.  INSTRUCTIONS:  Please follow up with the ENT specialist as referred for further evaluation of your sinus issues. Continue using the prescribed medications and follow the recommendations provided during your visit. If your symptoms persist or worsen, please schedule another appointment.

## 2024-05-21 NOTE — Progress Notes (Unsigned)
 "  Subjective:    Patient ID: Haley Hayden, female    DOB: [birth year], 64 y.o.   MRN: [id number]  Patient Care Team: [facility] as PCP - General Cindie Jesusa HERO, RN as Registered Nurse Dannielle Arlean FALCON, RN (Inactive) as Registered Nurse  Chief Complaint  Patient presents with   Consult    Patient seen at ED on 04/27/2024 due to pneumonia. Cough  and shortness of breath on exertion.    BACKGROUND:   HPI    Review of Systems A 10 point review of systems was performed and it is as noted above otherwise negative.   No past medical history on file.  No past surgical history on file.  There are no active problems to display for this patient.   Family History  Problem Relation Age of Onset   Breast cancer Neg Hx     Social History   Tobacco Use   Smoking status: Former   Smokeless tobacco: Never  Substance Use Topics   Alcohol use: Never    Allergies[1]  Active Medications[2]  Immunization History  Administered Date(s) Administered   Influenza-Unspecified 01/27/2022   PFIZER(Purple Top)SARS-COV-2 Vaccination 06/11/2019, 07/02/2019        Objective:     Vitals:   05/21/24 0813  BP: 130/86  Pulse: 71  Temp: 98 F (36.7 C)  Height: 5' 2 (1.575 m)  Weight: 187 lb (84.8 kg)  SpO2: 97%  TempSrc: Temporal  BMI (Calculated): 34.19     SpO2: 97 %  GENERAL: HEAD: Normocephalic, atraumatic.  EYES: Pupils equal, round, reactive to light.  No scleral icterus.  MOUTH:  NECK: Supple. No thyromegaly. Trachea midline. No JVD.  No adenopathy. PULMONARY: Good air entry bilaterally.  No adventitious sounds. CARDIOVASCULAR: S1 and S2. Regular rate and rhythm.  ABDOMEN: MUSCULOSKELETAL: No joint deformity, no clubbing, no edema.  NEUROLOGIC:  SKIN: Intact,warm,dry. PSYCH:  Lab Results  Component Value Date   NITRICOXIDE 14 05/21/2024  This result suggests low (<25) Type 2 (T2) airway inflammation indicating a low  likelihood of active T2-driven airway inflammation; reduced probability of response to inhaled corticosteroids.        Assessment & Plan:     ICD-10-CM   1. Shortness of breath  R06.02 Nitric oxide      Orders Placed This Encounter  Procedures   Nitric oxide    No orders of the defined types were placed in this encounter.    Advised if symptoms do not improve or worsen, to please contact office for sooner follow up or seek emergency care.    I spent xxx minutes of dedicated to the care of this patient on the date of this encounter to include pre-visit review of records, face-to-face time with the patient discussing conditions above, post visit ordering of testing, clinical documentation with the electronic health record, making appropriate referrals as documented, and communicating necessary findings to members of the patients care team.   C. Leita Sanders, MD [facility]    *This note was dictated using voice recognition software/Dragon.  Despite best efforts to proofread, errors can occur which can change the meaning. Any transcriptional errors that result from this process are unintentional and may not be fully corrected at the time of dictation.    [1] No Known Allergies [2]  Current Meds  Medication Sig   atorvastatin (LIPITOR) 20 MG tablet Take 20 mg by mouth at bedtime.   benzonatate (TESSALON) 100 MG capsule  Take 100 mg by mouth 3 (three) times daily as needed for cough.   Bisacodyl (LAXATIVE PO) Take 1 tablet by mouth at bedtime as needed.   Cholecalciferol 25 MCG (1000 UT) tablet Take 2,000 Units by mouth daily.   fluticasone (FLONASE) 50 MCG/ACT nasal spray Place 1 spray into both nostrils daily.   ondansetron  (ZOFRAN ) 4 MG tablet Take 4 mg by mouth every 8 (eight) hours as needed for nausea or vomiting.   PREMARIN 0.3 MG tablet Take 0.3 mg by mouth. Twice a week   prochlorperazine  (COMPAZINE ) 10 MG tablet Take 1  tablet (10 mg total) by mouth every 6 (six) hours as needed for nausea or vomiting.   promethazine-dextromethorphan (PROMETHAZINE-DM) 6.25-15 MG/5ML syrup Take 5 mLs by mouth at bedtime as needed for cough.   "

## 2024-07-05 ENCOUNTER — Encounter

## 2024-07-05 ENCOUNTER — Ambulatory Visit: Admitting: Pulmonary Disease
